# Patient Record
Sex: Female | Born: 1970 | Hispanic: No | Marital: Married | Smoking: Never smoker
Health system: Southern US, Community
[De-identification: ages and names within clinical notes are randomized; demographics above are authoritative.]

---

## 2020-06-18 ENCOUNTER — Other Ambulatory Visit: Payer: Self-pay

## 2020-06-18 ENCOUNTER — Emergency Department (HOSPITAL_BASED_OUTPATIENT_CLINIC_OR_DEPARTMENT_OTHER)

## 2020-06-18 ENCOUNTER — Encounter (HOSPITAL_BASED_OUTPATIENT_CLINIC_OR_DEPARTMENT_OTHER): Payer: Self-pay

## 2020-06-18 ENCOUNTER — Emergency Department (HOSPITAL_BASED_OUTPATIENT_CLINIC_OR_DEPARTMENT_OTHER)
Admission: EM | Admit: 2020-06-18 | Discharge: 2020-06-18 | Disposition: A | Discharge status: Home / Self Care | Attending: Emergency Medicine | Admitting: Emergency Medicine

## 2020-06-18 DIAGNOSIS — Z5321 Procedure and treatment not carried out due to patient leaving prior to being seen by health care provider: Secondary | ICD-10-CM | POA: Insufficient documentation

## 2020-06-18 DIAGNOSIS — M25531 Pain in right wrist: Secondary | ICD-10-CM | POA: Diagnosis present

## 2020-06-18 NOTE — ED Triage Notes (Signed)
Pt arrives with c/o pain to right wrist for 3 weeks, denies any injury, arrives in wrist brace states this helps the pain.

## 2020-06-19 ENCOUNTER — Encounter (HOSPITAL_BASED_OUTPATIENT_CLINIC_OR_DEPARTMENT_OTHER): Payer: Self-pay

## 2020-06-19 ENCOUNTER — Emergency Department (HOSPITAL_BASED_OUTPATIENT_CLINIC_OR_DEPARTMENT_OTHER)
Admission: EM | Admit: 2020-06-19 | Discharge: 2020-06-19 | Disposition: A | Discharge status: Home / Self Care | Attending: Emergency Medicine | Admitting: Emergency Medicine

## 2020-06-19 ENCOUNTER — Other Ambulatory Visit: Payer: Self-pay

## 2020-06-19 DIAGNOSIS — M25531 Pain in right wrist: Secondary | ICD-10-CM | POA: Diagnosis not present

## 2020-06-19 NOTE — Discharge Instructions (Addendum)
Your x-ray was negative for any fractures.  I suspect that your pain in your wrist is from an overuse injury.  I have written down knowledge Tylenol and ibuprofen to use as below.  You may also choose to use Aleve instead of ibuprofen because it is less frequent dosing.  Alternate between warm and cool compresses such as frozen peas or corn to help cool the area of this will help take down inflammation.  Please use Tylenol or ibuprofen for pain.  You may use 600 mg ibuprofen every 6 hours or 1000 mg of Tylenol every 6 hours.  You may choose to alternate between the 2.  This would be most effective.  Not to exceed 4 g of Tylenol within 24 hours.  Not to exceed 3200 mg ibuprofen 24 hours.   A wrist plan can also be very useful especially for overnight.  Please follow-up with your primary care doctor.  If you do not have a primary care provider he may follow-up with [facility]--this is a clinic that is located in [facility] which does not require insurance.  He denies return to the ER for any new or concerning symptoms.

## 2020-06-19 NOTE — ED Notes (Signed)
ED Provider at bedside. 

## 2020-06-19 NOTE — ED Triage Notes (Signed)
Pt c/o pain to right wrist x 3 weeks-denies injury-states she was here yesterday for same c/o-had xray-LWBS-NAD-steady gait

## 2020-06-19 NOTE — ED Provider Notes (Signed)
[facility] EMERGENCY DEPARTMENT Provider Note   CSN: [id number] Arrival date & time: 06/19/20  1500     History Chief Complaint  Patient presents with  . Wrist Pain    Samantha Stanley is a 50 y.o. female.  HPI Patient is a 50 year old female with no pertinent past medical history states that she is here for right wrist pain has been ongoing for several months but she states that over the past 3 weeks has been more annoying.  She states that she works with her [employer] and does a lot of repetitive motions.  She states that she came to the ER yesterday and had x-rays of her wrist however she had to leave prior to being seen.  Patient denies any other associate symptoms.  No numbness or weakness.    History reviewed. No pertinent past medical history.  There are no problems to display for this patient.   History reviewed. No pertinent surgical history.   OB History   No obstetric history on file.     No family history on file.  Social History   Tobacco Use  . Smoking status: Never Smoker  . Smokeless tobacco: Never Used  Substance Use Topics  . Alcohol use: Never  . Drug use: Never    Home Medications Prior to Admission medications   Not on File    Allergies    Patient has no known allergies.  Review of Systems   Review of Systems  Constitutional: Negative for fever.  HENT: Negative for congestion.   Respiratory: Negative for shortness of breath.   Cardiovascular: Negative for chest pain.  Gastrointestinal: Negative for abdominal distention.  Musculoskeletal:       Right wrist pain  Neurological: Negative for dizziness and headaches.    Physical Exam Updated Vital Signs BP 140/88 (BP Location: Left Arm)   Pulse (!) 59   Temp 98.2 F (36.8 C) (Oral)   Resp 18   Ht 4\' 11"  (1.499 m)   Wt 60.8 kg   SpO2 100%   BMI 27.06 kg/m   Physical Exam Vitals and nursing note reviewed.  Constitutional:      General: She is not in acute  distress.    Appearance: Normal appearance. She is not ill-appearing.  HENT:     Head: Normocephalic and atraumatic.  Eyes:     General: No scleral icterus.       Right eye: No discharge.        Left eye: No discharge.     Conjunctiva/sclera: Conjunctivae normal.  Pulmonary:     Effort: Pulmonary effort is normal.     Breath sounds: No stridor.  Musculoskeletal:     Comments: No significant tenderness to palpation of the left wrist.  Sensations intact able to flex and extend wrist against resistance.  Strength 5/5 bilateral grips.  Cap refill intact in all fingertips.  Neurological:     Mental Status: She is alert and oriented to person, place, and time. Mental status is at baseline.     ED Results / Procedures / Treatments   Labs (all labs ordered are listed, but only abnormal results are displayed) Labs Reviewed - No data to display  EKG None  Radiology DG Wrist Complete Right  Result Date: 06/18/2020 CLINICAL DATA:  Right wrist pain and swelling for 3 weeks. No known injury. EXAM: RIGHT WRIST - COMPLETE 3+ VIEW COMPARISON:  None. FINDINGS: There is no evidence of fracture or dislocation. There is no evidence of  arthropathy or other focal bone abnormality. Soft tissues are unremarkable. IMPRESSION: Negative. Electronically Signed   By: Duanne Guess D.O.   On: 06/18/2020 15:38    Procedures Procedures   Medications Ordered in ED Medications - No data to display  ED Course  I have reviewed the triage vital signs and the nursing notes.  Pertinent labs & imaging results that were available during my care of the patient were reviewed by me and considered in my medical decision making (see chart for details).    MDM Rules/Calculators/A&P                          Patient with right wrist pain for 3 weeks maybe longer.  Atraumatic.  Had x-ray done yesterday that patient left helping seen.  I reviewed x-ray there are no fractures or abnormalities noted on x-ray.  Physical  exam is quite reassuring.  Suspect overuse injury given she works at [employer] and does repetitive motions on a daily basis.  She has no numbness or weakness.  No neck pain doubt cervical radiculopathy or other structural disease.  Suspect overuse.  Rest ice elevate compression and NSAID recommendations given.  Patient is agreeable to plan.  All questions answered best my ability.  Final Clinical Impression(s) / ED Diagnoses Final diagnoses:  Right wrist pain    Rx / DC Orders ED Discharge Orders    None       Gailen Shelter, [place] 06/20/20 0131    Pricilla Loveless, MD 06/20/20 [phone]

## 2020-06-19 NOTE — ED Notes (Addendum)
Pt was seen here yesterday, left AMA, xray was completed yesterday and was negative.  Pt reports no injury, pain x 3 weeks

## 2022-02-28 IMAGING — DX DG WRIST COMPLETE 3+V*R*
4 series · 4 of 4 positions shown · non-contrast
Comparison: None.

CLINICAL DATA: Right wrist pain and swelling for 3 weeks. No known
injury.

EXAM:
RIGHT WRIST - COMPLETE 3+ VIEW

[wrist pa]
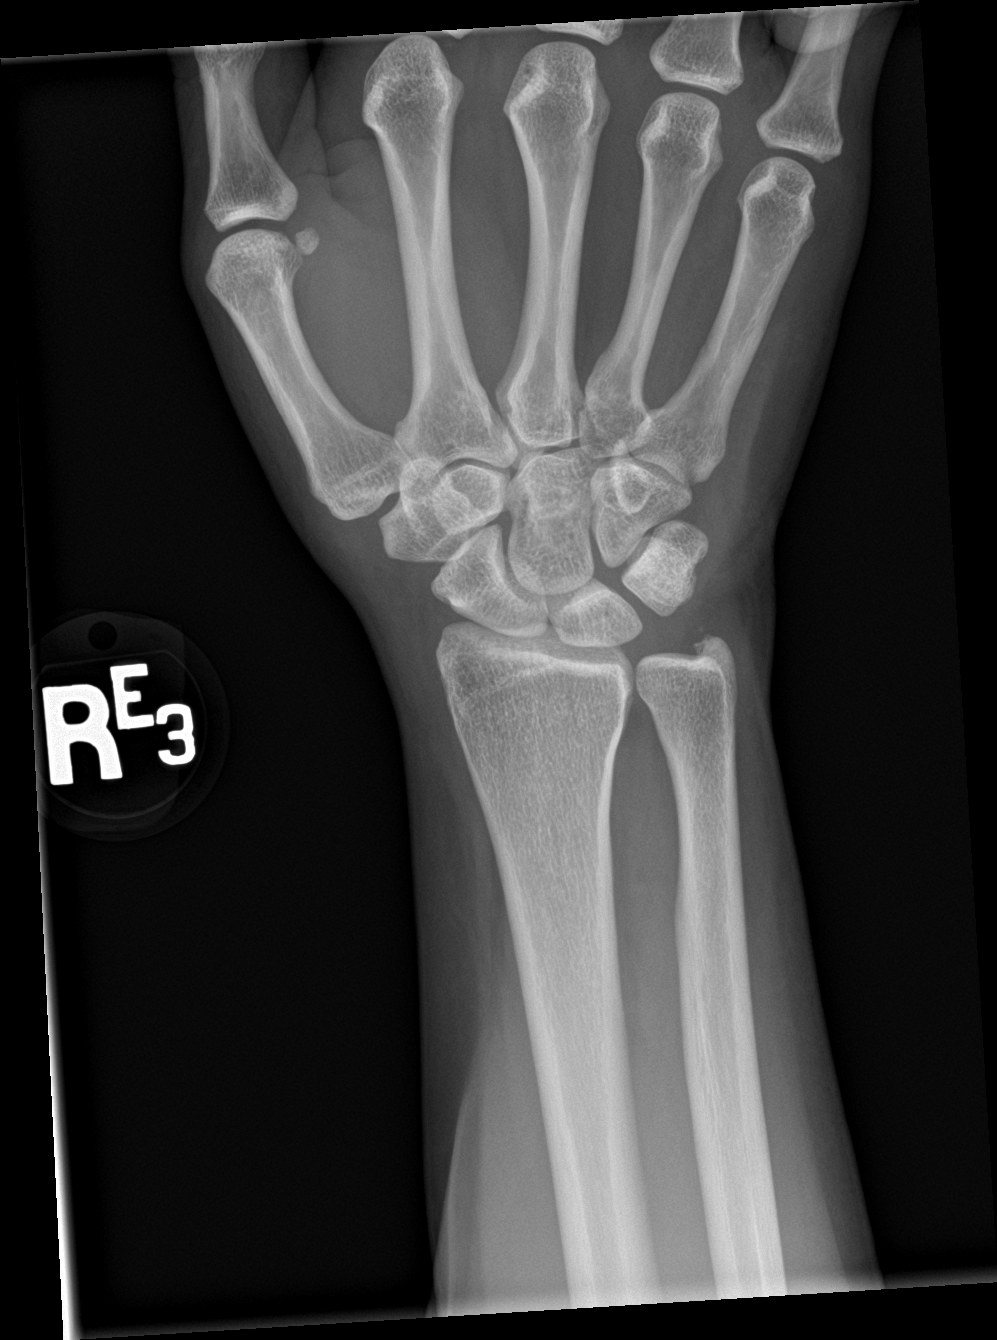

[wrist obl]
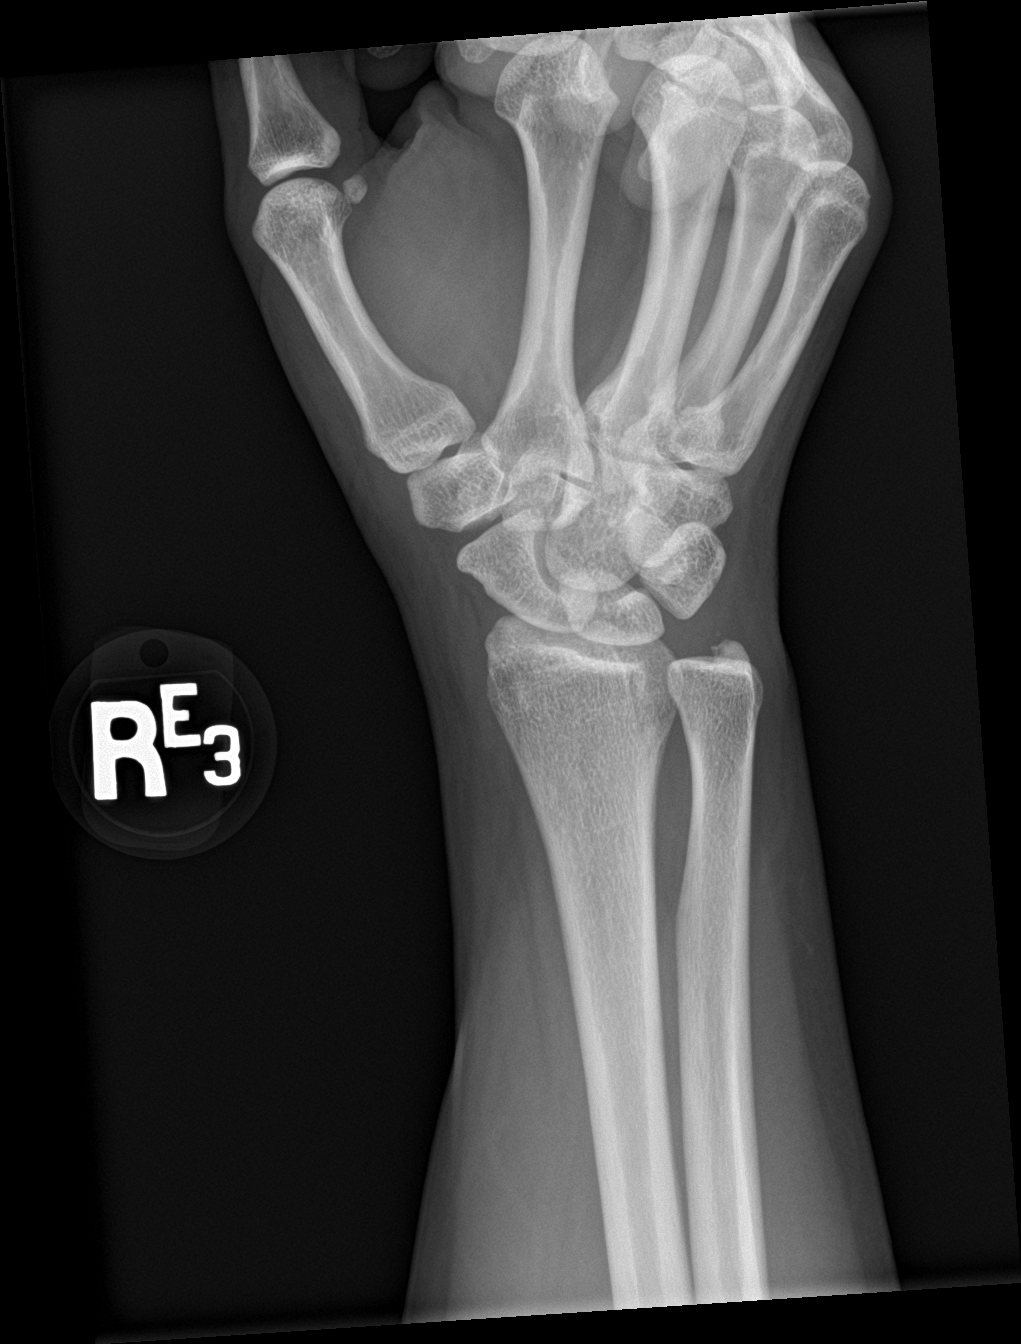

[wrist lat]
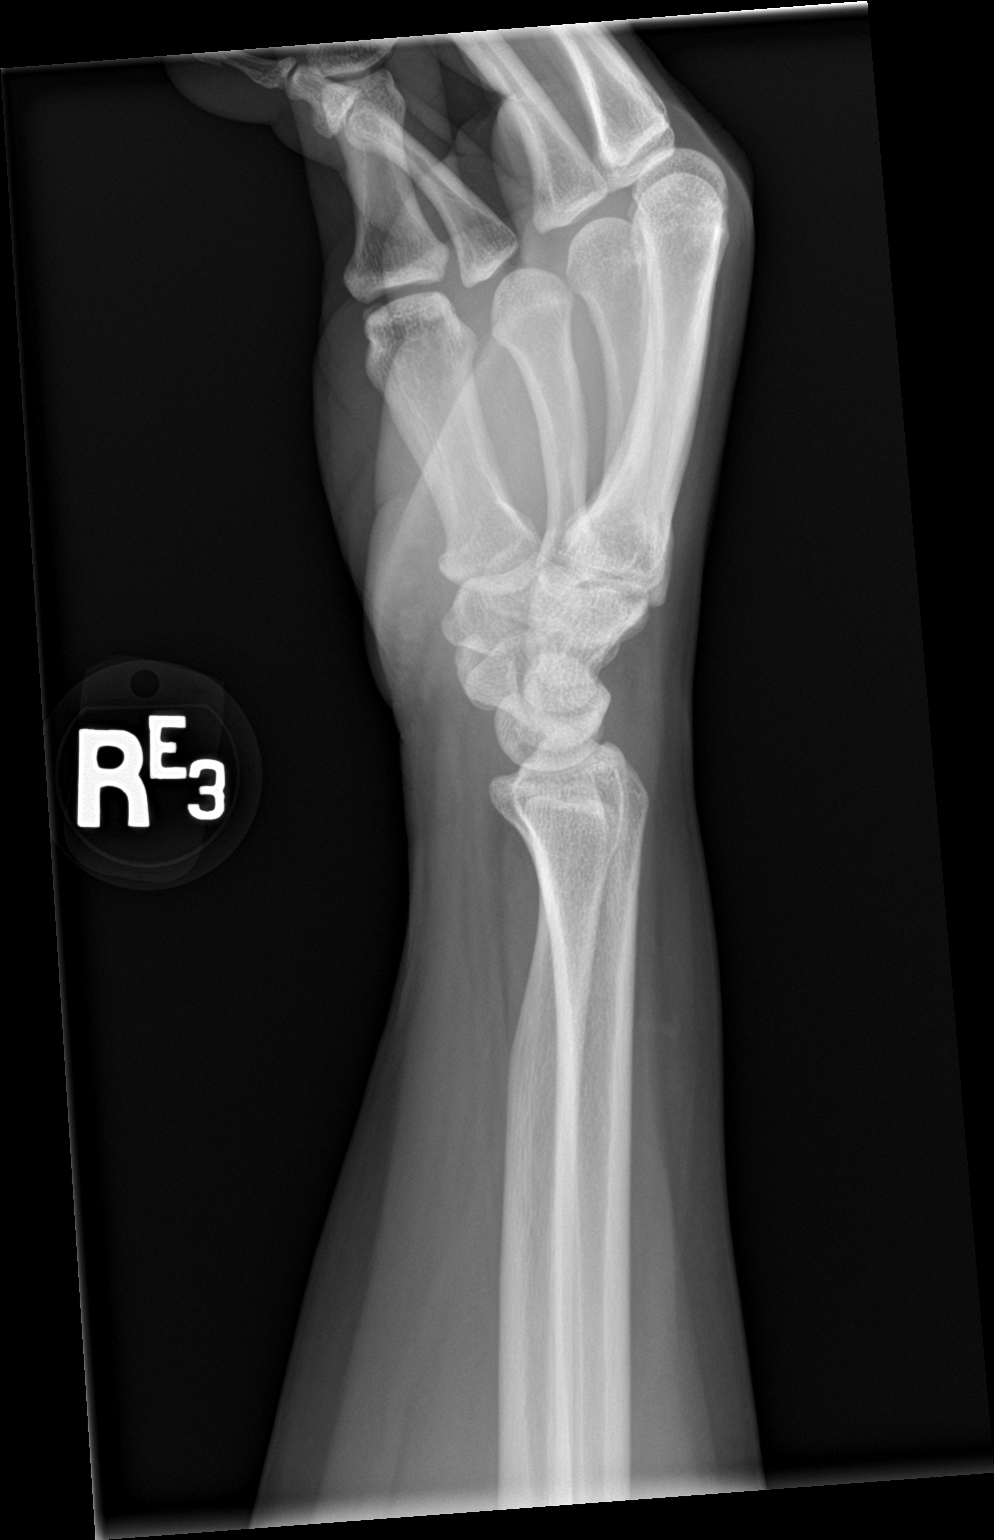

[wrist navicular]
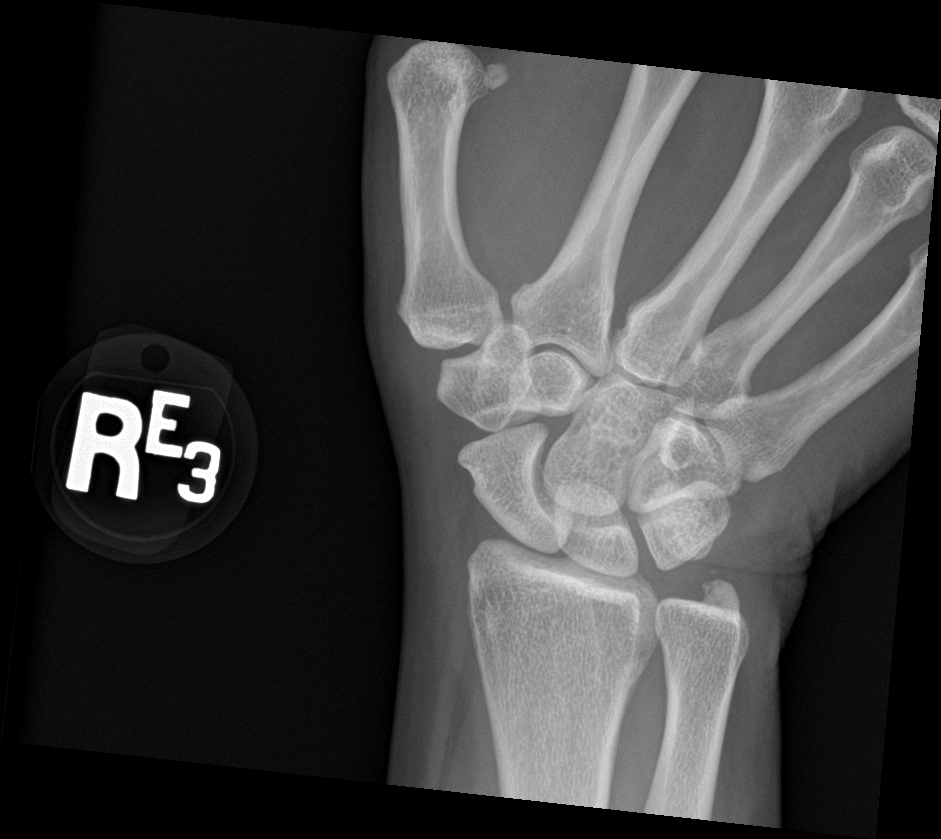

[4 of 4 positions shown; findings below may reference images not displayed]

FINDINGS: There is no evidence of fracture or dislocation. There is no
evidence of arthropathy or other focal bone abnormality. Soft
tissues are unremarkable.
IMPRESSION: Negative.
